# Patient Record
Sex: Female | Born: 1995 | Race: White | Hispanic: No | Marital: Single | Smoking: Never smoker
Health system: Southern US, Community
[De-identification: ages and names within clinical notes are randomized; demographics above are authoritative.]

## PROBLEM LIST (undated history)

## (undated) DIAGNOSIS — E669 Obesity, unspecified: Secondary | ICD-10-CM

## (undated) HISTORY — PX: APPENDECTOMY: SHX54

## (undated) HISTORY — DX: Obesity, unspecified: E66.9

---
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
## 2013-01-26 ENCOUNTER — Encounter (HOSPITAL_COMMUNITY): Payer: Self-pay | Admitting: Emergency Medicine

## 2013-01-26 ENCOUNTER — Emergency Department (HOSPITAL_COMMUNITY)
Admission: EM | Admit: 2013-01-26 | Discharge: 2013-01-26 | Disposition: A | Discharge status: Home / Self Care | Payer: BC Managed Care – PPO | Attending: Emergency Medicine | Admitting: Emergency Medicine

## 2013-01-26 DIAGNOSIS — E669 Obesity, unspecified: Secondary | ICD-10-CM | POA: Insufficient documentation

## 2013-01-26 DIAGNOSIS — J329 Chronic sinusitis, unspecified: Secondary | ICD-10-CM | POA: Insufficient documentation

## 2013-01-26 DIAGNOSIS — H109 Unspecified conjunctivitis: Secondary | ICD-10-CM | POA: Insufficient documentation

## 2013-01-26 DIAGNOSIS — Z792 Long term (current) use of antibiotics: Secondary | ICD-10-CM | POA: Insufficient documentation

## 2013-01-26 MED ORDER — TOBRAMYCIN 0.3 % OP SOLN
2.0000 [drp] | OPHTHALMIC | Status: DC
  Administered 2013-01-26: 2 [drp] via OPHTHALMIC
  Filled 2013-01-26: qty 5

## 2013-01-26 MED ORDER — SULFAMETHOXAZOLE-TRIMETHOPRIM 800-160 MG PO TABS: 1.0000 | ORAL_TABLET | Freq: Two times a day (BID) | ORAL | Status: AC

## 2013-01-26 MED ORDER — IBUPROFEN 800 MG PO TABS
800.0000 mg | ORAL_TABLET | Freq: Once | ORAL | Status: AC
  Administered 2013-01-26: 800 mg via ORAL
  Filled 2013-01-26: qty 1

## 2013-01-26 MED ORDER — PREDNISONE 50 MG PO TABS
60.0000 mg | ORAL_TABLET | Freq: Once | ORAL | Status: AC
  Administered 2013-01-26: 60 mg via ORAL
  Filled 2013-01-26 (×2): qty 1

## 2013-01-26 MED ORDER — SULFAMETHOXAZOLE-TMP DS 800-160 MG PO TABS
1.0000 | ORAL_TABLET | Freq: Once | ORAL | Status: AC
  Administered 2013-01-26: 1 via ORAL
  Filled 2013-01-26: qty 1

## 2013-01-26 MED ORDER — ONDANSETRON HCL 4 MG PO TABS
4.0000 mg | ORAL_TABLET | Freq: Once | ORAL | Status: AC
  Administered 2013-01-26: 4 mg via ORAL
  Filled 2013-01-26: qty 1

## 2013-01-26 MED ORDER — FEXOFENADINE-PSEUDOEPHED ER 60-120 MG PO TB12: 1.0000 | ORAL_TABLET | Freq: Two times a day (BID) | ORAL | Status: AC

## 2013-01-26 NOTE — ED Notes (Signed)
Pt c/o sinus congestion and pressure since Friday.  Says hurts to touch her face.

## 2013-01-26 NOTE — ED Provider Notes (Signed)
Medical screening examination/treatment/procedure(s) were performed by non-physician practitioner and as supervising physician I was immediately available for consultation/collaboration.  EKG Interpretation   None         Dalylah Ramey L Jakaria Lavergne, MD 01/26/13 1523 

## 2013-01-26 NOTE — ED Provider Notes (Signed)
CSN: [id number]     Arrival date & time 01/26/13  1131 History   First MD Initiated Contact with Patient 01/26/13 1318     Chief Complaint  Patient presents with  . Nasal Congestion   (Consider location/radiation/quality/duration/timing/severity/associated sxs/prior Treatment) HPI Comments: And patient presents to the emergency department with complaint of nasal congestion, sinus pressure, and facial pain. The patient states this is been going on for 3 days. She denies any bleeding from her nasal passages. She's noticed any blood in the mucus when she blows her nose. She is unsure of any temperature elevation, but states that she fell she had some chills on yesterday. There's been no injury or trauma to the face.  The history is provided by the patient.    Past Medical History  Diagnosis Date  . Obesity, unspecified 08/27/2012   Past Surgical History  Procedure Laterality Date  . Appendectomy     No family history on file. History  Substance Use Topics  . Smoking status: Never Smoker   . Smokeless tobacco: Not on file  . Alcohol Use: No   OB History   Grav Para Term Preterm Abortions TAB SAB Ect Mult Living                 Review of Systems  Constitutional: Negative for activity change.       All ROS Neg except as noted in HPI  HENT: Negative for nosebleeds.   Eyes: Negative for photophobia and discharge.  Respiratory: Negative for cough, shortness of breath and wheezing.   Cardiovascular: Negative for chest pain and palpitations.  Gastrointestinal: Negative for abdominal pain and blood in stool.  Genitourinary: Negative for dysuria, frequency and hematuria.  Musculoskeletal: Negative for arthralgias, back pain and neck pain.  Skin: Negative.   Neurological: Negative for dizziness, seizures and speech difficulty.  Psychiatric/Behavioral: Negative for hallucinations and confusion.    Allergies  Review of patient's allergies indicates no known allergies.  Home  Medications   Current Outpatient Rx  Name  Route  Sig  Dispense  Refill  . amoxicillin (AMOXIL) 875 MG tablet   Oral   Take 1 tablet (875 mg total) by mouth 2 (two) times daily.   20 tablet   0   . EXPIRED: omeprazole (PRILOSEC) 20 MG capsule   Oral   Take 1 capsule (20 mg total) by mouth daily.   30 capsule   0    BP 139/81  Pulse 94  Temp(Src) 98.1 F (36.7 C) (Oral)  Resp 18  Ht 5\' 4"  (1.626 m)  Wt 208 lb (94.348 kg)  BMI 35.69 kg/m2  SpO2 99%  LMP 01/19/2013 Physical Exam  Nursing note and vitals reviewed. Constitutional: She is oriented to person, place, and time. She appears well-developed and well-nourished.  Non-toxic appearance.  HENT:  Head: Normocephalic.  Right Ear: Tympanic membrane and external ear normal.  Left Ear: Tympanic membrane and external ear normal.  There is pain to percussion over the maxillary sinuses.  The nasal turbinates are swollen and boggy.  The oropharynx is clear. The uvula is in the midline. The speech is clear and understandable.  Eyes: EOM and lids are normal. Pupils are equal, round, and reactive to light.  Neck: Normal range of motion. Neck supple. Carotid bruit is not present.  Cardiovascular: Normal rate, regular rhythm, normal heart sounds, intact distal pulses and normal pulses.   Pulmonary/Chest: Breath sounds normal. No respiratory distress.  Abdominal: Soft. Bowel sounds are normal. There is  no tenderness. There is no guarding.  Musculoskeletal: Normal range of motion.  Lymphadenopathy:       Head (right side): No submandibular adenopathy present.       Head (left side): No submandibular adenopathy present.    She has no cervical adenopathy.  Neurological: She is alert and oriented to person, place, and time. She has normal strength. No cranial nerve deficit or sensory deficit.  Skin: Skin is warm and dry.  Psychiatric: She has a normal mood and affect. Her speech is normal.    ED Course  Procedures (including  critical care time) Labs Review Labs Reviewed - No data to display Imaging Review No results found.  EKG Interpretation   None       MDM  No diagnosis found. **I have reviewed nursing notes, vital signs, and all appropriate lab and imaging results for this patient.*  Patient has pain to palpation and percussion of the maxillary sinuses. There is increased redness of the conjunctiva right and left.  The plan at this time is for the patient to be placed on a prednisone taper, Allegra D2 times daily for the congestion, and Septra DS. Patient is to see her primary physician or return to the emergency department if not improving.  Kathie Dike, PA-C 01/26/13 1353

## 2013-05-21 IMAGING — CR DG ABDOMEN ACUTE W/ 1V CHEST
3 series · 3 of 3 positions shown · non-contrast
Comparison: 09/08/2001 CT

CLINICAL DATA: Nausea, epigastric pain

ACUTE ABDOMEN SERIES (ABDOMEN 2 VIEW & CHEST 1 VIEW)

[view not recorded (1 of 3)]
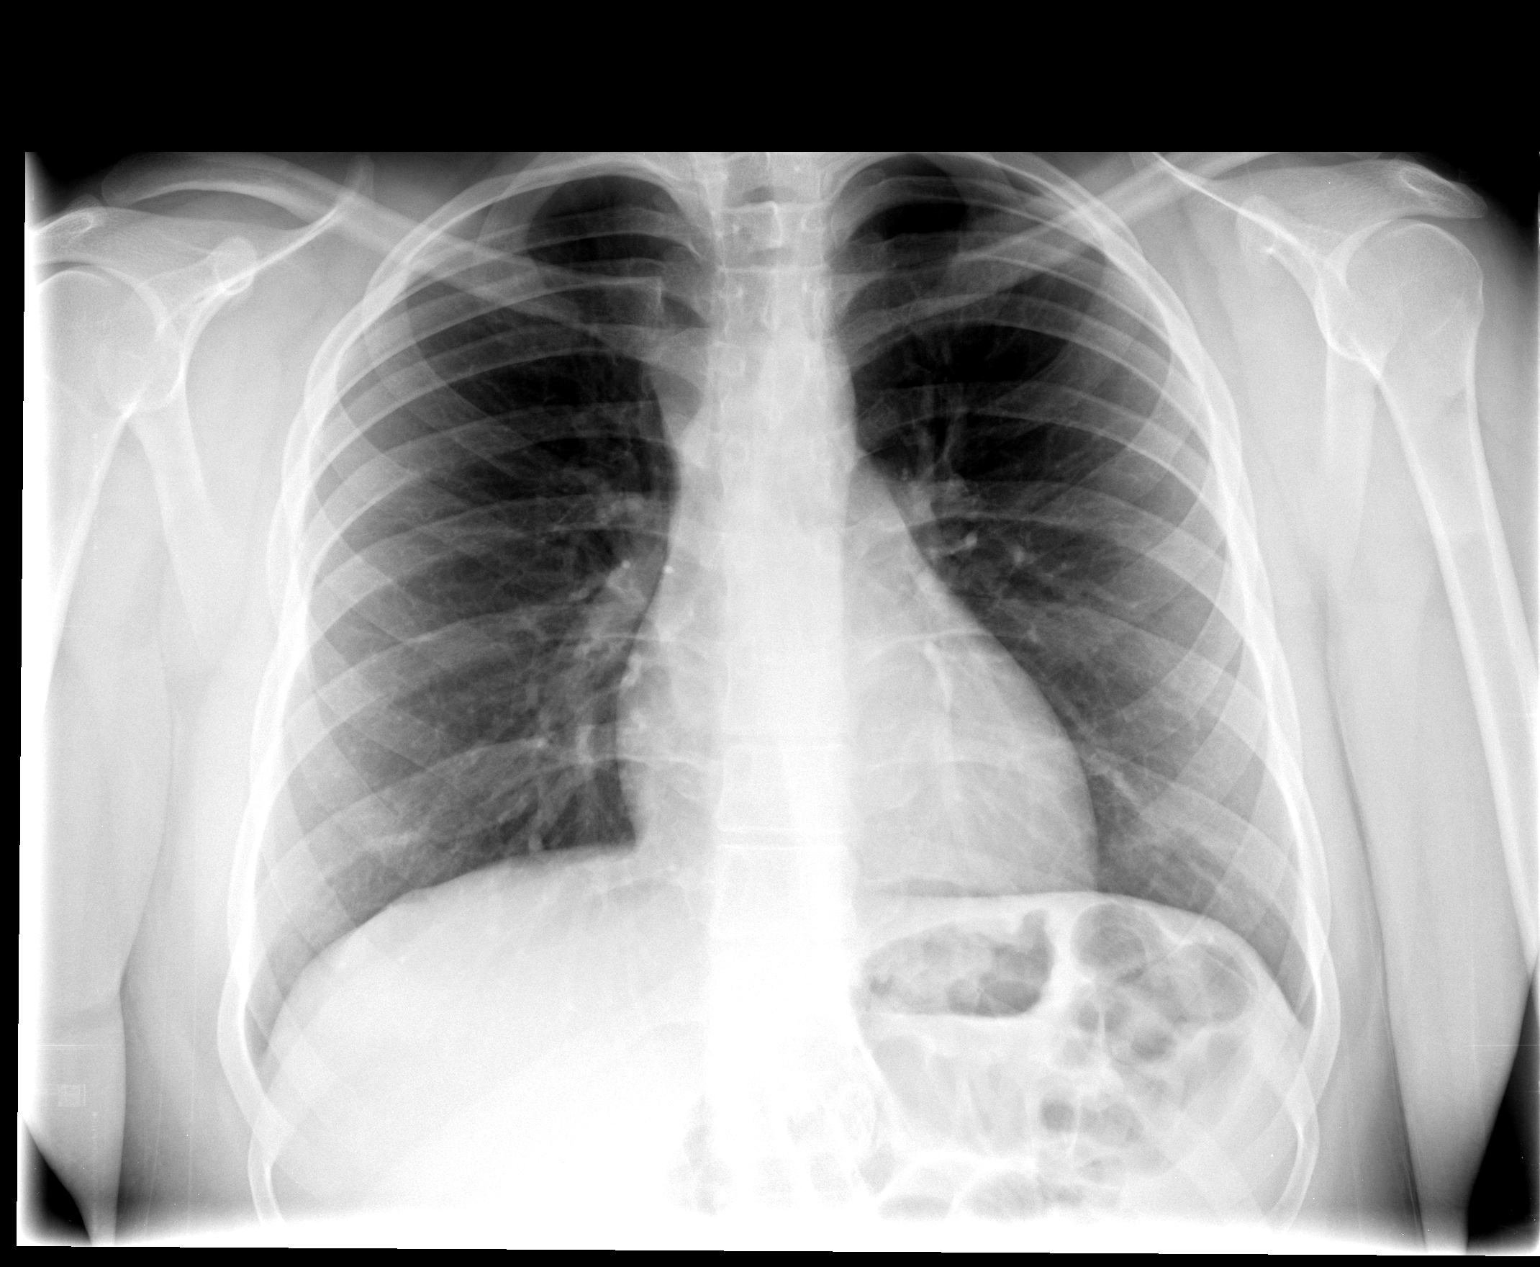

[view not recorded (2 of 3)]
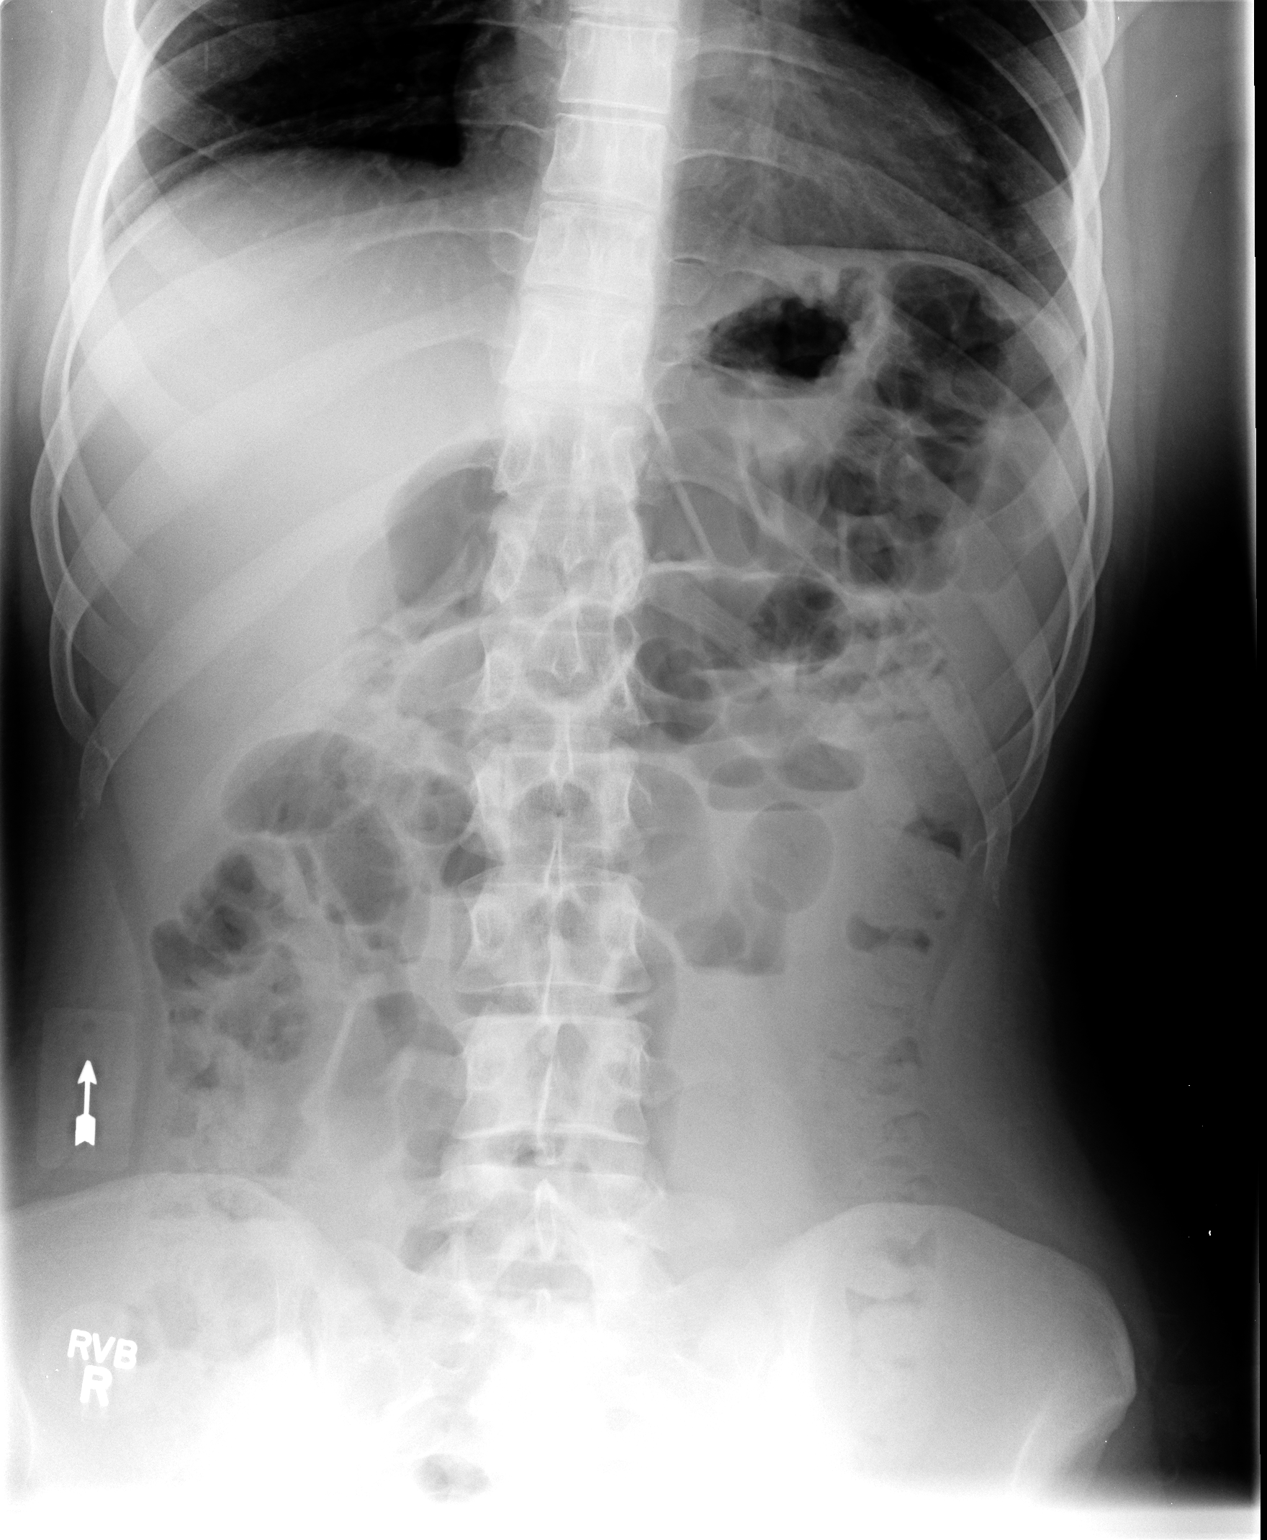

[view not recorded (3 of 3)]
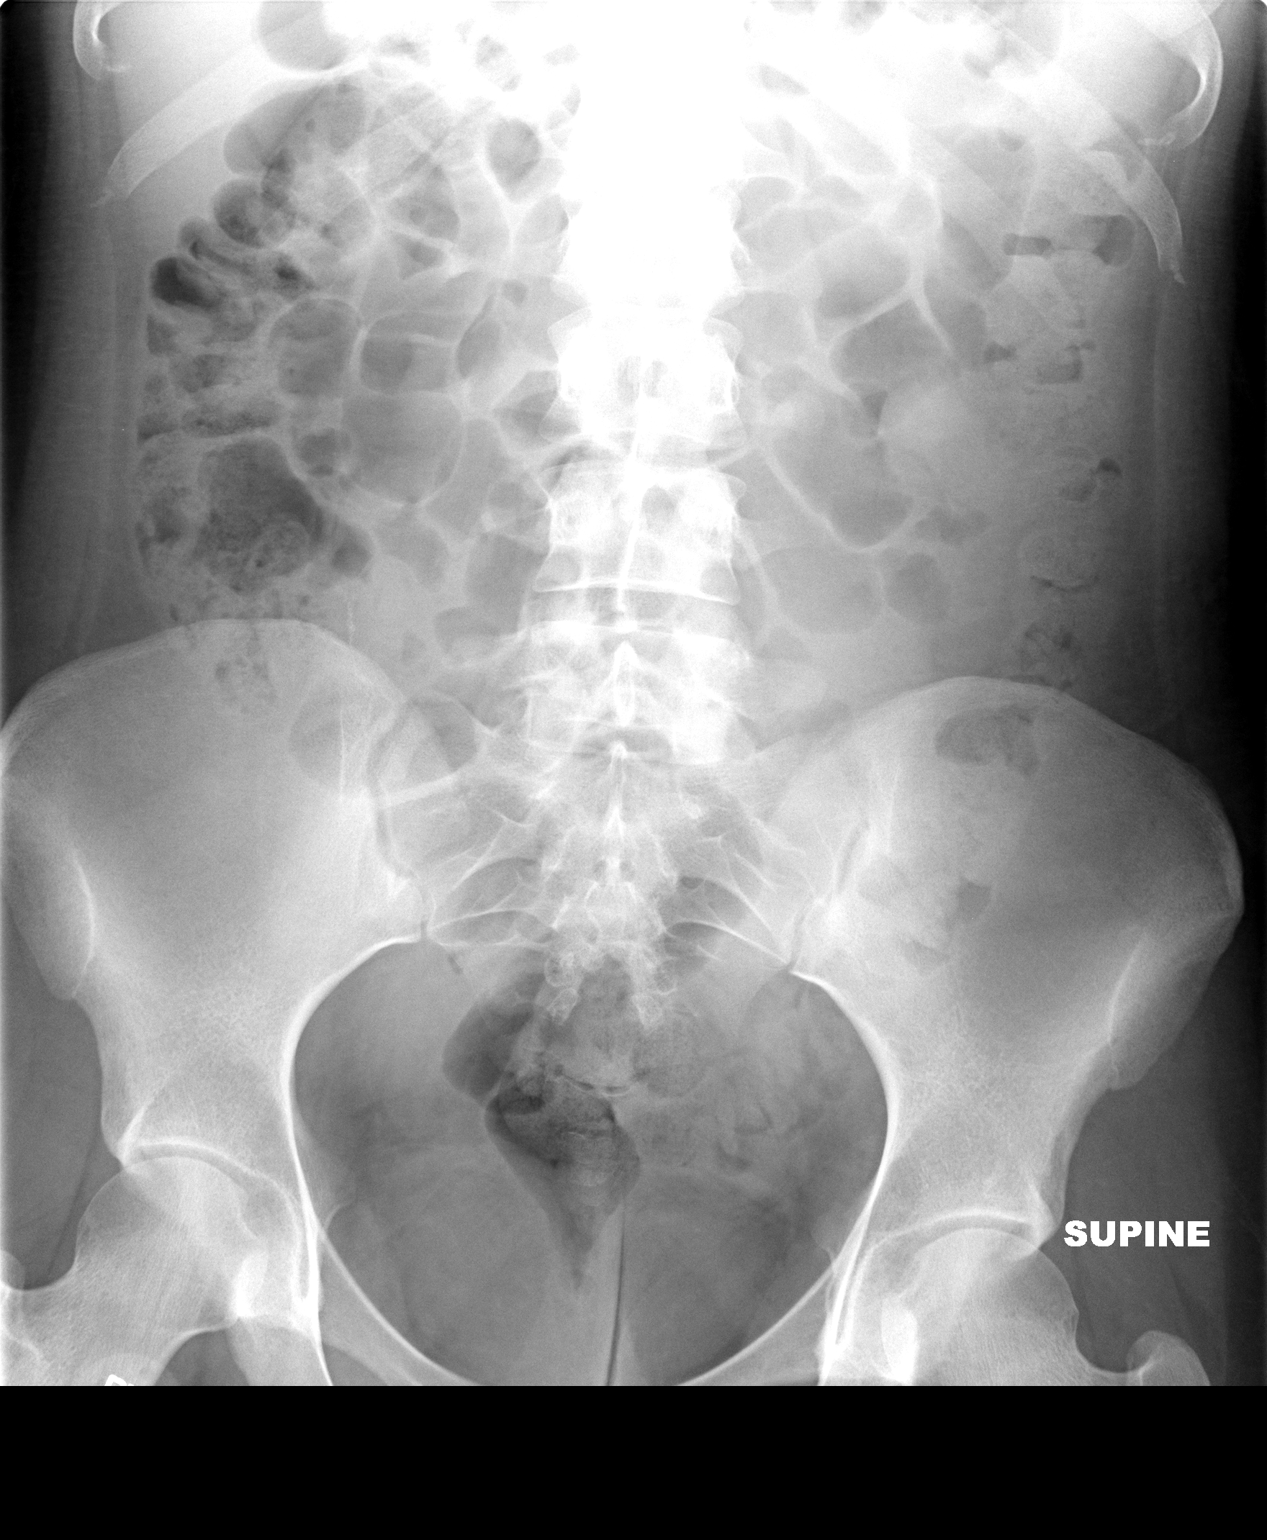

[3 of 3 positions shown; findings below may reference images not displayed]

FINDINGS: Lungs are clear.  Cardiomediastinal contours are within
normal limits.  No free intraperitoneal air.

The bowel gas pattern is non-obstructive. Organ outlines are normal
where seen. No acute or aggressive osseous abnormality identified.
IMPRESSION: Nonobstructive bowel gas pattern.

## 2020-11-23 ENCOUNTER — Ambulatory Visit
Admission: EM | Admit: 2020-11-23 | Discharge: 2020-11-23 | Disposition: A | Discharge status: Home / Self Care | Payer: BC Managed Care – PPO | Attending: Urgent Care | Admitting: Urgent Care

## 2020-11-23 ENCOUNTER — Encounter: Payer: Self-pay | Admitting: Emergency Medicine

## 2020-11-23 ENCOUNTER — Other Ambulatory Visit: Payer: Self-pay

## 2020-11-23 DIAGNOSIS — J020 Streptococcal pharyngitis: Secondary | ICD-10-CM

## 2020-11-23 DIAGNOSIS — J018 Other acute sinusitis: Secondary | ICD-10-CM

## 2020-11-23 MED ORDER — CETIRIZINE HCL 10 MG PO TABS: 10.0000 mg | ORAL_TABLET | Freq: Every day | ORAL | 0 refills | Status: AC

## 2020-11-23 MED ORDER — PSEUDOEPHEDRINE HCL 60 MG PO TABS: 60.0000 mg | ORAL_TABLET | Freq: Three times a day (TID) | ORAL | 0 refills | Status: AC | PRN

## 2020-11-23 MED ORDER — AMOXICILLIN 875 MG PO TABS: 875.0000 mg | ORAL_TABLET | Freq: Two times a day (BID) | ORAL | 0 refills | Status: AC

## 2020-11-23 NOTE — ED Triage Notes (Addendum)
Patient c/o nasal congestion x 2 weeks.   Patient denies fever.   Patient endorses worsening symptoms, patient states " this morning I woke up with burning pain behind my eyes".   Patient endorses sinus pressure.   Patient has taken Sudafed, Nyquil, and Dayquil with no relief of symptoms.

## 2020-11-23 NOTE — ED Provider Notes (Signed)
[facility]   MRN: [id number] DOB: 02/11/1995  Subjective:   Gail Perry is a 25 y.o. female presenting for 2-week history of persistent and worsening sinus pressure, sinus pain behind both of her eyes, runny and stuffy nose, bilateral ear fullness.  No cough, chest pain, shortness of breath.  Has been using DayQuil, NyQuil, Sudafed.  Patient is not a smoker.  Does not want a COVID test.  No current facility-administered medications for this encounter.  Current Outpatient Medications:    amoxicillin (AMOXIL) 875 MG tablet, Take 1 tablet (875 mg total) by mouth 2 (two) times daily., Disp: 20 tablet, Rfl: 0   fexofenadine-pseudoephedrine (ALLEGRA-D) 60-120 MG per tablet, Take 1 tablet by mouth every 12 (twelve) hours., Disp: 30 tablet, Rfl: 0   omeprazole (PRILOSEC) 20 MG capsule, Take 1 capsule (20 mg total) by mouth daily., Disp: 30 capsule, Rfl: 0   sulfamethoxazole-trimethoprim (SEPTRA DS) 800-160 MG per tablet, Take 1 tablet by mouth 2 (two) times daily., Disp: 28 tablet, Rfl: 0   No Known Allergies  Past Medical History:  Diagnosis Date   Obesity, unspecified 08/27/2012     Past Surgical History:  Procedure Laterality Date   APPENDECTOMY      History reviewed. No pertinent family history.  Social History   Tobacco Use   Smoking status: Never   Smokeless tobacco: Never  Vaping Use   Vaping Use: Never used  Substance Use Topics   Alcohol use: No   Drug use: No    ROS   Objective:   Vitals: BP (!) 150/95 (BP Location: Right Arm)   Pulse 74   Temp 98.3 F (36.8 C) (Oral)   Resp 17   LMP 11/02/2020 (Approximate)   SpO2 98%   Physical Exam Constitutional:      General: She is not in acute distress.    Appearance: She is well-developed. She is not ill-appearing, toxic-appearing or diaphoretic.  HENT:     Head: Normocephalic and atraumatic.     Right Ear: Tympanic membrane, ear canal and external ear normal. No drainage or tenderness. No  middle ear effusion. There is no impacted cerumen. Tympanic membrane is not erythematous.     Left Ear: Tympanic membrane, ear canal and external ear normal. No drainage or tenderness.  No middle ear effusion. There is no impacted cerumen. Tympanic membrane is not erythematous.     Nose: Congestion present. No rhinorrhea.     Comments: Erythematous nasal mucosa, sinus tenderness.    Mouth/Throat:     Mouth: Mucous membranes are moist. No oral lesions.     Pharynx: No pharyngeal swelling, oropharyngeal exudate, posterior oropharyngeal erythema or uvula swelling.     Tonsils: No tonsillar exudate or tonsillar abscesses.  Eyes:     General: No scleral icterus.       Right eye: No discharge.        Left eye: No discharge.     Extraocular Movements: Extraocular movements intact.     Right eye: Normal extraocular motion.     Left eye: Normal extraocular motion.     Conjunctiva/sclera: Conjunctivae normal.     Pupils: Pupils are equal, round, and reactive to light.  Cardiovascular:     Rate and Rhythm: Normal rate.  Pulmonary:     Effort: Pulmonary effort is normal.  Musculoskeletal:     Cervical back: Normal range of motion and neck supple.  Lymphadenopathy:     Cervical: No cervical adenopathy.  Skin:    General:  Skin is warm and dry.  Neurological:     General: No focal deficit present.     Mental Status: She is alert and oriented to person, place, and time.  Psychiatric:        Mood and Affect: Mood normal.        Behavior: Behavior normal.      Assessment and Plan :   PDMP not reviewed this encounter.  1. Acute non-recurrent sinusitis of other sinus   2. Streptococcal sore throat    Will start empiric treatment for sinusitis with amoxicillin.  Recommended supportive care otherwise including the use of oral antihistamine, continued use of the decongestant. Counseled patient on potential for adverse effects with medications prescribed/recommended today, ER and return-to-clinic  precautions discussed, patient verbalized understanding.    Wallis Bamberg, PA-C 11/23/20 1313

## 2022-05-10 ENCOUNTER — Ambulatory Visit
Admission: EM | Admit: 2022-05-10 | Discharge: 2022-05-10 | Disposition: A | Discharge status: Home / Self Care | Payer: Self-pay

## 2022-05-10 DIAGNOSIS — B9789 Other viral agents as the cause of diseases classified elsewhere: Secondary | ICD-10-CM

## 2022-05-10 DIAGNOSIS — Z1152 Encounter for screening for COVID-19: Secondary | ICD-10-CM

## 2022-05-10 DIAGNOSIS — J019 Acute sinusitis, unspecified: Secondary | ICD-10-CM | POA: Insufficient documentation

## 2022-05-10 MED ORDER — FLUTICASONE PROPIONATE 50 MCG/ACT NA SUSP: 2.0000 | Freq: Every day | NASAL | 0 refills | Status: AC

## 2022-05-10 MED ORDER — PSEUDOEPH-BROMPHEN-DM 30-2-10 MG/5ML PO SYRP: 5.0000 mL | ORAL_SOLUTION | Freq: Four times a day (QID) | ORAL | 0 refills | Status: AC | PRN

## 2022-05-10 MED ORDER — ACETAMINOPHEN 500 MG PO TABS
1000.0000 mg | ORAL_TABLET | Freq: Once | ORAL | Status: AC
  Administered 2022-05-10: 1000 mg via ORAL

## 2022-05-10 NOTE — Discharge Instructions (Addendum)
COVID test is pending.  You will be contacted if your COVID test is positive.  You are able to receive molnupiravir as treatment for COVID if you choose to do so. Take medicine as prescribed. Increase fluids and allow for plenty of rest. Continue Tylenol or ibuprofen as needed for pain, fever, or general discomfort. Recommend normal saline nasal spray throughout the day to help with nasal congestion and runny nose.  You may use Afrin only as needed, do not use regularly as this can cause nosebleeds and cause you to have rebound nasal congestion. Recommend using a humidifier in your bedroom at night to help with nasal congestion, you can also use a steamy shower to help with congestion as well. If symptoms do not improve by 05/14/2022, a prescription for Augmentin has been sent to your preferred pharmacy to pick up and to start. If symptoms do not improve with this treatment, please follow-up with your primary care physician or in this clinic for further evaluation. Follow-up as needed

## 2022-05-10 NOTE — ED Triage Notes (Signed)
Pt reports she has some nasal congestion that has worked its way into her head, causing her a headache x 3 days. Pt also states she has head pressure.  Pt took an allergy pill, sudafed, and afrin nasal spray but no relief.

## 2022-05-10 NOTE — ED Provider Notes (Signed)
[facility]    CSN: [id number] Arrival date & time: 05/10/22  1744      History   Chief Complaint No chief complaint on file.   HPI Gail Perry is a 27 y.o. female.   The history is provided by the patient.   The patient presents for complaints of nasal congestion, headache, and runny nose.  Symptoms have been present for the past 3 days.  She also complains of sinus pressure.  She denies fever, chills, ear pain, sore throat, wheezing, shortness of breath, difficulty breathing, or GI symptoms.  Patient states that she has been taking generic Claritin along with Sudafed and using Afrin nasal spray with minimal relief.  Patient denies history of seasonal allergies.  Patient states that she does have a history of high blood pressure for which she takes Cozaar daily.  Reports that she did take her medication today.  Patient denies any obvious known sick contacts.  Past Medical History:  Diagnosis Date   Obesity, unspecified 08/27/2012    Patient Active Problem List   Diagnosis Date Noted   Obesity, unspecified 08/27/2012    Past Surgical History:  Procedure Laterality Date   APPENDECTOMY      OB History   No obstetric history on file.      Home Medications    Prior to Admission medications   Medication Sig Start Date End Date Taking? Authorizing Provider  escitalopram (LEXAPRO) 10 MG tablet Take 10 mg by mouth daily. 04/02/22  Yes [provider]  losartan (COZAAR) 25 MG tablet Take 25 mg by mouth daily. 01/22/22  Yes [provider]  amoxicillin (AMOXIL) 875 MG tablet Take 1 tablet (875 mg total) by mouth 2 (two) times daily. 11/23/20   Jaynee Eagles, PA-C  cetirizine (ZYRTEC ALLERGY) 10 MG tablet Take 1 tablet (10 mg total) by mouth daily. 11/23/20   Jaynee Eagles, PA-C  fexofenadine-pseudoephedrine (ALLEGRA-D) 60-120 MG per tablet Take 1 tablet by mouth every 12 (twelve) hours. 01/26/13   Lily Kocher, PA-C  omeprazole (PRILOSEC) 20 MG  capsule Take 1 capsule (20 mg total) by mouth daily. 09/15/11 09/14/12  Rancour, Annie Main, MD  pseudoephedrine (SUDAFED) 60 MG tablet Take 1 tablet (60 mg total) by mouth every 8 (eight) hours as needed for congestion. 11/23/20   Jaynee Eagles, PA-C  sulfamethoxazole-trimethoprim (SEPTRA DS) 800-160 MG per tablet Take 1 tablet by mouth 2 (two) times daily. 01/26/13   Lily Kocher, PA-C    Family History History reviewed. No pertinent family history.  Social History Social History   Tobacco Use   Smoking status: Never   Smokeless tobacco: Never  Vaping Use   Vaping Use: Never used  Substance Use Topics   Alcohol use: No   Drug use: No     Allergies   Patient has no known allergies.   Review of Systems Review of Systems Per HPI  Physical Exam Triage Vital Signs ED Triage Vitals  Enc Vitals Group     BP 05/10/22 1749 (!) 151/100     Pulse Rate 05/10/22 1749 96     Resp 05/10/22 1749 20     Temp 05/10/22 1749 98.7 F (37.1 C)     Temp Source 05/10/22 1749 Oral     SpO2 05/10/22 1749 98 %     Weight --      Height --      Head Circumference --      Peak Flow --      Pain Score  05/10/22 1752 6     Pain Loc --      Pain Edu? --      Excl. in [place]? --    No data found.  Updated Vital Signs BP (!) 151/100 (BP Location: Right Arm)   Pulse 96   Temp 98.7 F (37.1 C) (Oral)   Resp 20   SpO2 98%   Visual Acuity Right Eye Distance:   Left Eye Distance:   Bilateral Distance:    Right Eye Near:   Left Eye Near:    Bilateral Near:     Physical Exam Vitals and nursing note reviewed.  Constitutional:      General: She is not in acute distress.    Appearance: Normal appearance.  HENT:     Head: Normocephalic.     Right Ear: Tympanic membrane, ear canal and external ear normal.     Left Ear: Tympanic membrane, ear canal and external ear normal.     Nose: Congestion present. No rhinorrhea.     Right Turbinates: Enlarged and swollen.     Left Turbinates: Enlarged  and swollen.     Right Sinus: Maxillary sinus tenderness and frontal sinus tenderness present.     Left Sinus: Maxillary sinus tenderness and frontal sinus tenderness present.     Mouth/Throat:     Lips: Pink.     Mouth: Mucous membranes are moist.     Pharynx: Uvula midline. Posterior oropharyngeal erythema present. No pharyngeal swelling.     Comments: Cobblestoning on posterior oropharnyx Eyes:     Extraocular Movements: Extraocular movements intact.     Conjunctiva/sclera: Conjunctivae normal.     Pupils: Pupils are equal, round, and reactive to light.  Cardiovascular:     Rate and Rhythm: Normal rate and regular rhythm.     Pulses: Normal pulses.     Heart sounds: Normal heart sounds.  Pulmonary:     Effort: Pulmonary effort is normal. No respiratory distress.     Breath sounds: Normal breath sounds. No stridor. No wheezing, rhonchi or rales.  Abdominal:     General: Bowel sounds are normal.     Palpations: Abdomen is soft.     Tenderness: There is no abdominal tenderness.  Musculoskeletal:     Cervical back: Normal range of motion.  Lymphadenopathy:     Cervical: No cervical adenopathy.  Skin:    General: Skin is warm and dry.  Neurological:     General: No focal deficit present.     Mental Status: She is alert and oriented to person, place, and time.  Psychiatric:        Mood and Affect: Mood normal.        Behavior: Behavior normal.      UC Treatments / Results  Labs (all labs ordered are listed, but only abnormal results are displayed) Labs Reviewed  SARS CORONAVIRUS 2 (TAT 6-24 HRS)    EKG   Radiology No results found.  Procedures Procedures (including critical care time)  Medications Ordered in UC Medications  acetaminophen (TYLENOL) tablet 1,000 mg (1,000 mg Oral Given 05/10/22 1806)    Initial Impression / Assessment and Plan / UC Course  I have reviewed the triage vital signs and the nursing notes.  Pertinent labs & imaging results that were  available during my care of the patient were reviewed by me and considered in my medical decision making (see chart for details).  The patient is well-appearing, she is in no acute distress, she is hypertensive;  however, patient has been taking Sudafed for her current symptoms.  Patient was advised to follow-up with her primary care physician regarding her elevated blood pressure and to continue to monitor her blood pressure at home.  COVID test is pending.  Patient is a candidate to receive molnupiravir if her COVID test is positive.  No recent creatinine results are available in the patient's chart.  Suspect symptoms have most likely been caused by allergic rhinitis.  Will start patient on fluticasone 50 mcg nasal spray for her nasal congestion, Bromfed-DM for her cough and to act as an antihistamine, and have her continue the current allergy medicine she is taking.  Will approach patient's symptoms with a watch and wait strategy since they have been present for the past 3 days.  If symptoms do not improve over the next 4 days, Augmentin 875/125 mg has been sent to the patient's preferred pharmacy to pick up and start.  Supportive care recommendations were provided and discussed with the patient to include increasing fluids, allowing for plenty of rest, continuing over-the-counter analgesics for pain and discomfort, and use of normal saline nasal spray for nasal congestion or runny nose.  Patient advised that if her symptoms fail to improve, would like for her to follow-up in this clinic or with her primary care physician for further evaluation.  Patient was advised to follow-up with PCP if her blood pressure remains elevated.  Patient is in agreement with this plan of care and verbalizes understanding.  All questions were answered.  Patient stable for discharge.  Work note was provided.   Final Clinical Impressions(s) / UC Diagnoses   Final diagnoses:  Encounter for screening for COVID-19   Discharge  Instructions   None    ED Prescriptions   None    PDMP not reviewed this encounter.   Tish Men, NP 05/10/22 1812

## 2022-05-11 LAB — SARS CORONAVIRUS 2 (TAT 6-24 HRS): SARS Coronavirus 2: NEGATIVE

## 2022-05-15 ENCOUNTER — Telehealth: Payer: Self-pay | Admitting: Emergency Medicine

## 2022-05-15 MED ORDER — AMOXICILLIN-POT CLAVULANATE 875-125 MG PO TABS: 1.0000 | ORAL_TABLET | Freq: Two times a day (BID) | ORAL | 0 refills | Status: AC

## 2022-05-15 NOTE — Telephone Encounter (Signed)
[facility] did not have Rx for Augmentin.  Rx resent.
